# Patient Record
Sex: Male | Born: 1955 | Race: White | Hispanic: No | Marital: Married | Smoking: Never smoker
Health system: Southern US, Community
[De-identification: ages and names within clinical notes are randomized; demographics above are authoritative.]

---

## 2006-06-17 ENCOUNTER — Ambulatory Visit (HOSPITAL_COMMUNITY): Admission: RE | Admit: 2006-06-17 | Discharge: 2006-06-17 | Payer: Self-pay | Admitting: Family Medicine

## 2006-07-24 ENCOUNTER — Ambulatory Visit (HOSPITAL_COMMUNITY): Admission: RE | Admit: 2006-07-24 | Discharge: 2006-07-24 | Payer: Self-pay | Admitting: Internal Medicine

## 2006-07-24 ENCOUNTER — Ambulatory Visit: Payer: Self-pay | Admitting: Internal Medicine

## 2007-07-18 IMAGING — CR DG CHEST 2V
2 series · 2 of 2 positions shown · non-contrast
Comparison: none

HISTORY: Former smoker

CHEST 2 VIEWS:
No prior studies for comparison
Normal heart size, mediastinal contours, and pulmonary vascularity.
Paucity of pulmonary markings in the upper lobes with mild bronchitic changes,
question mild COPD.
No infiltrate, effusion, or mass.
Bones unremarkable.
Minimal biapical scarring.

[view not recorded (1 of 2)]
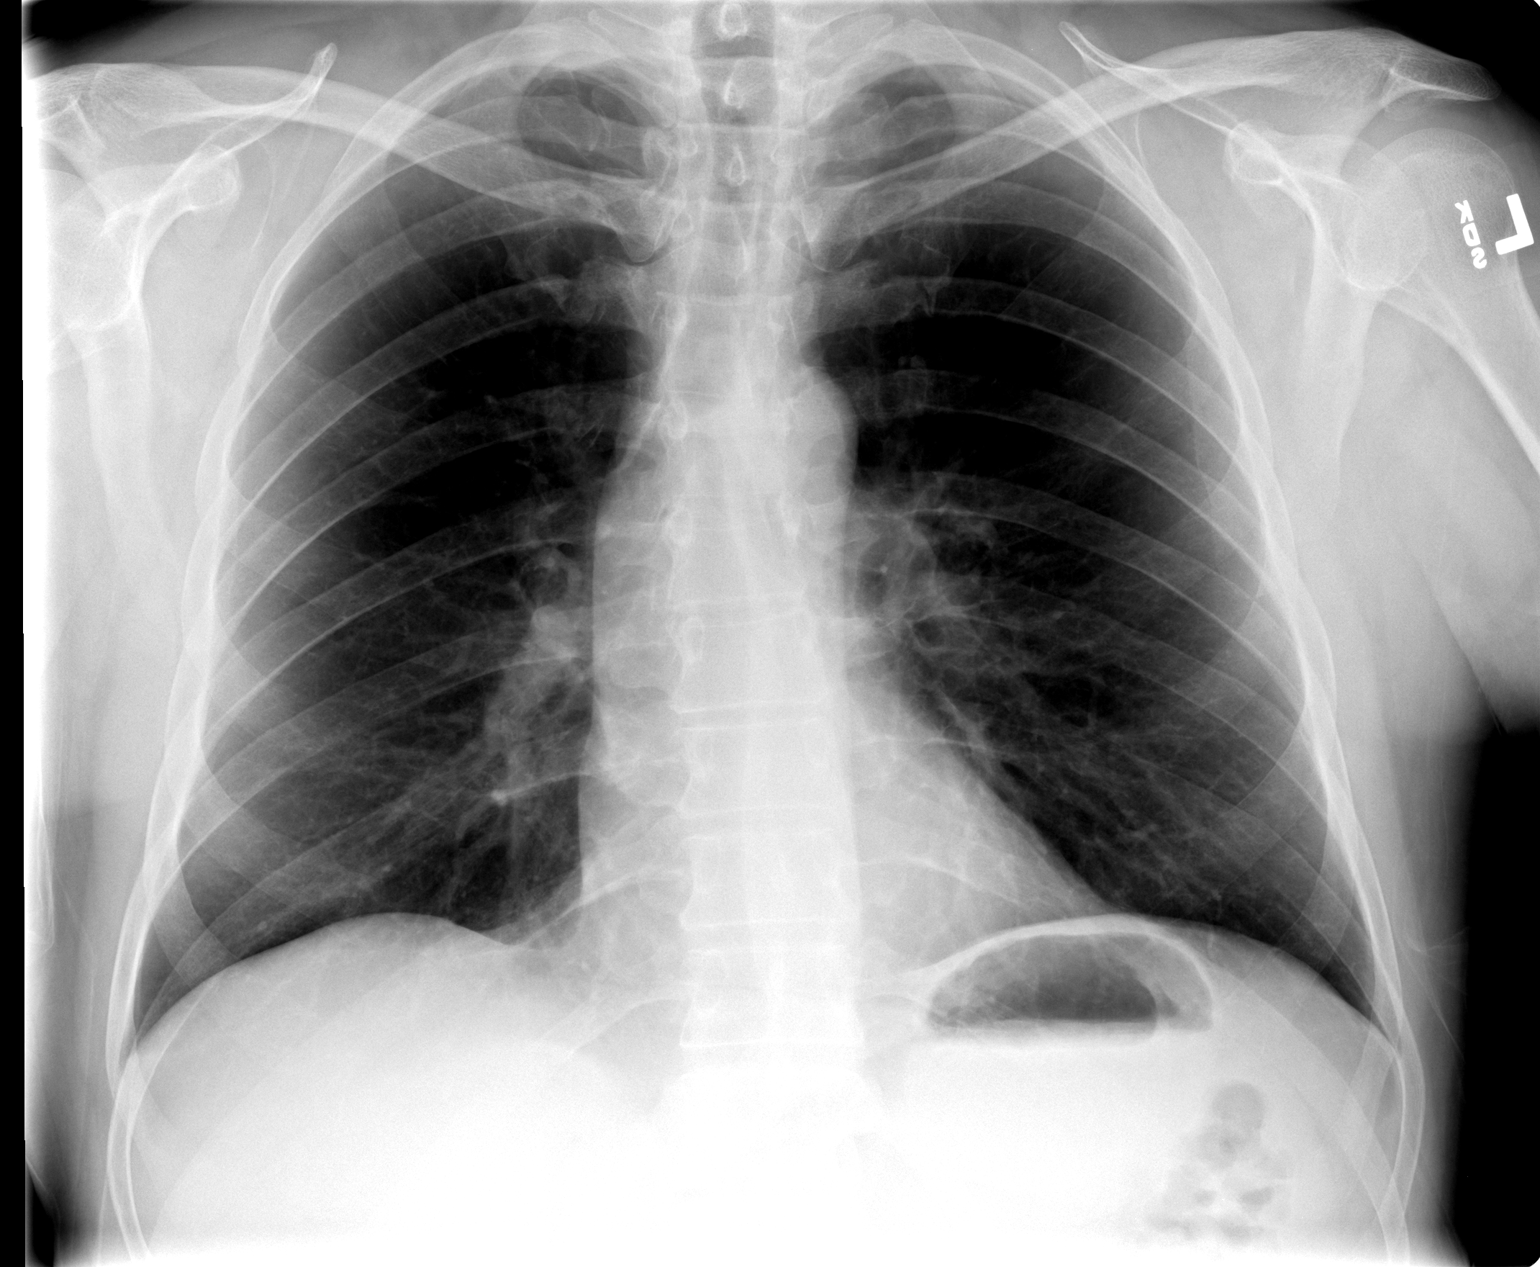

[view not recorded (2 of 2)]
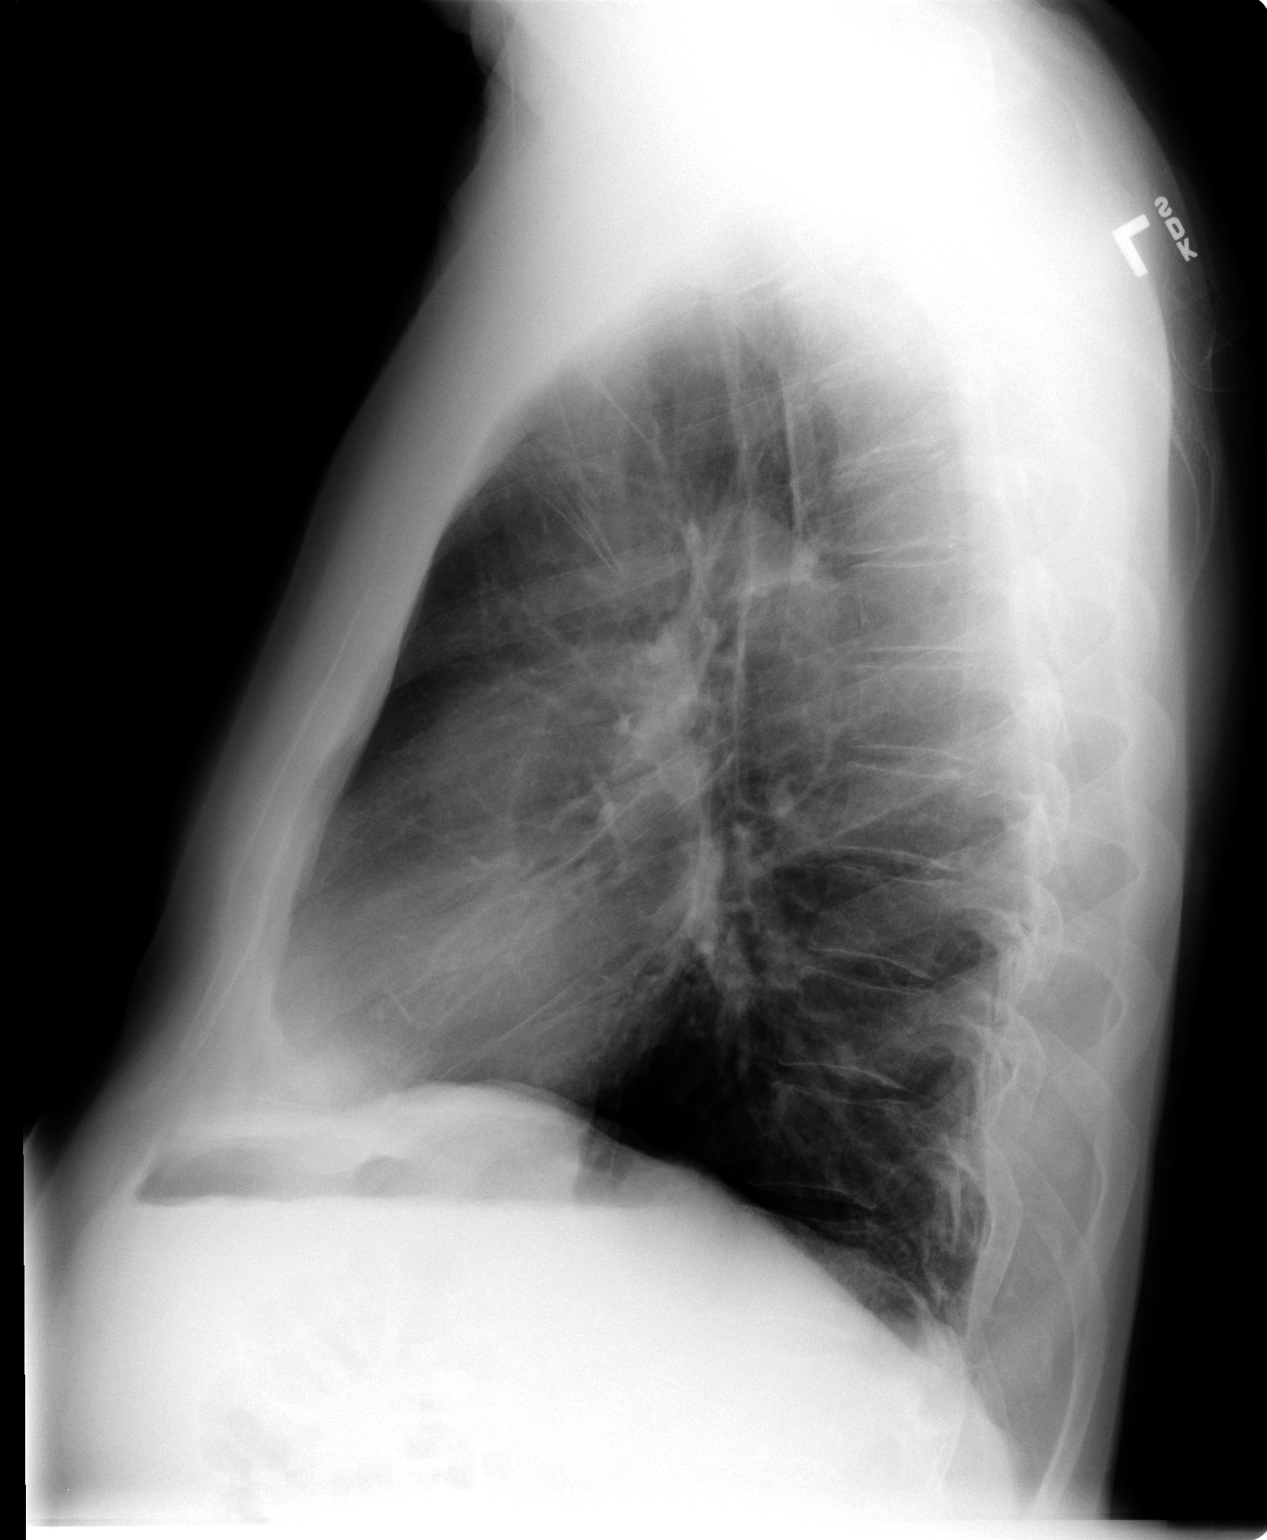

[2 of 2 positions shown; findings below may reference images not displayed]

IMPRESSION: Question COPD.
No acute abnormalities.

## 2011-04-29 ENCOUNTER — Emergency Department (HOSPITAL_COMMUNITY): Payer: BC Managed Care – PPO

## 2011-04-29 ENCOUNTER — Emergency Department (HOSPITAL_COMMUNITY)
Admission: EM | Admit: 2011-04-29 | Discharge: 2011-04-29 | Disposition: A | Discharge status: Home / Self Care | Payer: BC Managed Care – PPO | Attending: Emergency Medicine | Admitting: Emergency Medicine

## 2011-04-29 DIAGNOSIS — R071 Chest pain on breathing: Secondary | ICD-10-CM | POA: Insufficient documentation

## 2011-04-29 DIAGNOSIS — S2249XA Multiple fractures of ribs, unspecified side, initial encounter for closed fracture: Secondary | ICD-10-CM | POA: Insufficient documentation

## 2021-03-28 ENCOUNTER — Encounter (INDEPENDENT_AMBULATORY_CARE_PROVIDER_SITE_OTHER): Payer: Self-pay | Admitting: *Deleted

## 2021-07-26 ENCOUNTER — Telehealth (INDEPENDENT_AMBULATORY_CARE_PROVIDER_SITE_OTHER): Payer: Self-pay

## 2021-07-26 ENCOUNTER — Encounter (INDEPENDENT_AMBULATORY_CARE_PROVIDER_SITE_OTHER): Payer: Self-pay

## 2021-07-26 ENCOUNTER — Other Ambulatory Visit (INDEPENDENT_AMBULATORY_CARE_PROVIDER_SITE_OTHER): Payer: Self-pay

## 2021-07-26 ENCOUNTER — Encounter (INDEPENDENT_AMBULATORY_CARE_PROVIDER_SITE_OTHER): Payer: Self-pay | Admitting: *Deleted

## 2021-07-26 DIAGNOSIS — Z86010 Personal history of colonic polyps: Secondary | ICD-10-CM

## 2021-07-26 DIAGNOSIS — Z1211 Encounter for screening for malignant neoplasm of colon: Secondary | ICD-10-CM

## 2021-07-26 DIAGNOSIS — I10 Essential (primary) hypertension: Secondary | ICD-10-CM

## 2021-07-26 MED ORDER — PEG 3350-KCL-NA BICARB-NACL 420 G PO SOLR: 4000.0000 mL | Freq: Once | ORAL | 0 refills | Status: AC

## 2021-07-26 NOTE — Telephone Encounter (Signed)
Referring MD/PCP: Faythe Dingwall   Procedure: Tcs   Reason/Indication:  Hx of polyps  Has patient had this procedure before?  Yes, 6 yrs ago  If so, when, by whom and where?    Is there a family history of colon cancer?  No  Who?  What age when diagnosed?    Is patient diabetic? If yes, Type 1 or Type 2   No      Does patient have prosthetic heart valve or mechanical valve?  No  Do you have a pacemaker/defibrillator?  No  Has patient ever had endocarditis/atrial fibrillation? No  Does patient use oxygen? No  Has patient had joint replacement within last 12 months?  No  Is patient constipated or do they take laxatives? No  Does patient have a history of alcohol/drug use?  No  Have you had a stroke/heart attack last 6 mths? No  Do you take medicine for weight loss?  No  For male patients,: do you still have your menstrual cycle? N/A  Is patient on blood thinner such as Coumadin, Plavix and/or Aspirin? No  Medications: Amlodipine 5 mg daily,Hctz 12.5 mg daily  Allergies: NKDA  Medication Adjustment per Dr Levon Hedger none.  Procedure date & time: 08/25/2021 at 7:30 am

## 2021-07-26 NOTE — Telephone Encounter (Signed)
George Green, CMA 

## 2021-08-17 NOTE — Telephone Encounter (Signed)
Ok to schedule.  Thanks,  Teffany Blaszczyk Castaneda Mayorga, MD Gastroenterology and Hepatology [facility]  

## 2021-08-25 ENCOUNTER — Encounter (HOSPITAL_COMMUNITY): Admission: RE | Payer: Self-pay | Source: Home / Self Care

## 2021-08-25 ENCOUNTER — Ambulatory Visit (HOSPITAL_COMMUNITY): Admission: RE | Admit: 2021-08-25 | Payer: Medicare HMO | Source: Home / Self Care | Admitting: Gastroenterology

## 2021-08-25 SURGERY — COLONOSCOPY WITH PROPOFOL
Anesthesia: Monitor Anesthesia Care

## 2022-08-08 ENCOUNTER — Encounter: Payer: Self-pay | Admitting: Emergency Medicine

## 2022-08-08 ENCOUNTER — Other Ambulatory Visit: Payer: Self-pay

## 2022-08-08 ENCOUNTER — Ambulatory Visit
Admission: EM | Admit: 2022-08-08 | Discharge: 2022-08-08 | Disposition: A | Discharge status: Home / Self Care | Payer: Medicare HMO | Attending: Nurse Practitioner | Admitting: Nurse Practitioner

## 2022-08-08 DIAGNOSIS — Z20822 Contact with and (suspected) exposure to covid-19: Secondary | ICD-10-CM | POA: Diagnosis present

## 2022-08-08 DIAGNOSIS — Z1152 Encounter for screening for COVID-19: Secondary | ICD-10-CM | POA: Diagnosis present

## 2022-08-08 NOTE — Discharge Instructions (Addendum)
Your COVID test is pending.  As discussed, if your COVID test is positive, you are are a candidate to receive the antiviral molnupiravir. Continue to monitor for development of symptoms.  If symptoms develop after you have received the results of your COVID test, recommend retesting. Warm salt water gargles 3-4 times daily as needed for throat discomfort. Follow-up in this clinic or with your primary care physician as needed.

## 2022-08-08 NOTE — ED Provider Notes (Signed)
[facility]    CSN: [id number] Arrival date & time: 08/08/22  9326      History   Chief Complaint Chief Complaint  Patient presents with   Covid Exposure    HPI George Green is a 66 y.o. male.   The history is provided by the patient.   Patient presents requesting a COVID test.  Patient states his wife tested positive for COVID 1 day ago.  Patient states wife began developing symptoms 4 days ago.  Patient states today he does have a mild "scratchy throat".  He denies fever, chills, headache, cough, wheezing, shortness of breath, or GI symptoms.  Patient states that he has received 2 COVID vaccines.  History reviewed. No pertinent past medical history.  There are no problems to display for this patient.   History reviewed. No pertinent surgical history.     Home Medications    Prior to Admission medications   Not on File    Family History History reviewed. No pertinent family history.  Social History Social History   Tobacco Use   Smoking status: Never   Smokeless tobacco: Never  Substance Use Topics   Alcohol use: Yes    Comment: occ     Allergies   Patient has no allergy information on record.   Review of Systems Review of Systems Per HPI  Physical Exam Triage Vital Signs ED Triage Vitals  Enc Vitals Group     BP 08/08/22 0902 (!) 165/92     Pulse Rate 08/08/22 0902 79     Resp 08/08/22 0902 20     Temp 08/08/22 0902 98.1 F (36.7 C)     Temp Source 08/08/22 0902 Oral     SpO2 08/08/22 0902 97 %     Weight --      Height --      Head Circumference --      Peak Flow --      Pain Score 08/08/22 0903 0     Pain Loc --      Pain Edu? --      Excl. in GC? --    No data found.  Updated Vital Signs BP (!) 165/92 (BP Location: Right Arm)   Pulse 79   Temp 98.1 F (36.7 C) (Oral)   Resp 20   SpO2 97%   Visual Acuity Right Eye Distance:   Left Eye Distance:   Bilateral Distance:    Right Eye Near:   Left Eye Near:     Bilateral Near:     Physical Exam Vitals and nursing note reviewed.  Constitutional:      General: He is not in acute distress.    Appearance: Normal appearance.  HENT:     Head: Normocephalic.     Right Ear: Tympanic membrane, ear canal and external ear normal.     Left Ear: Tympanic membrane, ear canal and external ear normal.     Nose: Nose normal. No congestion.     Mouth/Throat:     Lips: Pink.     Mouth: Mucous membranes are moist.     Pharynx: Oropharynx is clear. Uvula midline. Posterior oropharyngeal erythema present.     Tonsils: No tonsillar exudate. 0 on the right. 0 on the left.  Eyes:     Extraocular Movements: Extraocular movements intact.     Conjunctiva/sclera: Conjunctivae normal.     Pupils: Pupils are equal, round, and reactive to light.  Cardiovascular:     Rate and Rhythm:  Normal rate and regular rhythm.     Heart sounds: Normal heart sounds.  Pulmonary:     Effort: Pulmonary effort is normal. No respiratory distress.     Breath sounds: Normal breath sounds. No stridor. No wheezing, rhonchi or rales.  Abdominal:     General: Bowel sounds are normal.     Palpations: Abdomen is soft.  Musculoskeletal:     Cervical back: Normal range of motion.  Lymphadenopathy:     Cervical: No cervical adenopathy.  Skin:    General: Skin is warm and dry.  Neurological:     General: No focal deficit present.     Mental Status: He is alert and oriented to person, place, and time.  Psychiatric:        Mood and Affect: Mood normal.        Behavior: Behavior normal.      UC Treatments / Results  Labs (all labs ordered are listed, but only abnormal results are displayed) Labs Reviewed  SARS CORONAVIRUS 2 (TAT 6-24 HRS)    EKG   Radiology No results found.  Procedures Procedures (including critical care time)  Medications Ordered in UC Medications - No data to display  Initial Impression / Assessment and Plan / UC Course  I have reviewed the triage  vital signs and the nursing notes.  Pertinent labs & imaging results that were available during my care of the patient were reviewed by me and considered in my medical decision making (see chart for details).  Patient presents for COVID testing.  Patient reports his wife tested positive for COVID 1 day ago.  He states his wife symptoms started 4 days ago.  On exam, his vital signs are stable, he is in no acute distress.  Discussed with patient that if he begins to develop worsening symptoms, will recommend retesting if his COVID results are negative.  Patient is a candidate to receive molnupiravir.  Patient was given supportive care recommendations.  Patient verbalizes understanding.  All questions were answered. Final Clinical Impressions(s) / UC Diagnoses   Final diagnoses:  Encounter for screening for COVID-19  Exposure to COVID-19 virus     Discharge Instructions      Your COVID test is pending.  As discussed, if your COVID test is positive, you are are a candidate to receive the antiviral molnupiravir. Continue to monitor for development of symptoms.  If symptoms develop after you have received the results of your COVID test, recommend retesting. Warm salt water gargles 3-4 times daily as needed for throat discomfort. Follow-up in this clinic or with your primary care physician as needed.     ED Prescriptions   None    PDMP not reviewed this encounter.   Tish Men, NP 08/08/22 [phone]

## 2022-08-08 NOTE — ED Triage Notes (Signed)
Pt reports would like a covid test and reports spouse tested positive yesterday. Pt denies any current symptoms.

## 2022-08-09 LAB — SARS CORONAVIRUS 2 (TAT 6-24 HRS): SARS Coronavirus 2: NEGATIVE

## 2022-12-06 ENCOUNTER — Encounter: Payer: Self-pay | Admitting: *Deleted

## 2023-05-20 ENCOUNTER — Encounter: Payer: Self-pay | Admitting: *Deleted

## 2024-01-15 ENCOUNTER — Encounter (INDEPENDENT_AMBULATORY_CARE_PROVIDER_SITE_OTHER): Payer: Self-pay | Admitting: *Deleted

## 2024-02-24 LAB — COLOGUARD: COLOGUARD: NEGATIVE

## 2024-07-14 ENCOUNTER — Encounter (INDEPENDENT_AMBULATORY_CARE_PROVIDER_SITE_OTHER): Payer: Self-pay | Admitting: *Deleted
